# Patient Record
Sex: Female | Born: 1965 | Race: Black or African American | Hispanic: No | Smoking: Never smoker
Health system: Southern US, Community
[De-identification: ages and names within clinical notes are randomized; demographics above are authoritative.]

## PROBLEM LIST (undated history)

## (undated) DIAGNOSIS — Z201 Contact with and (suspected) exposure to tuberculosis: Secondary | ICD-10-CM

## (undated) DIAGNOSIS — M940 Chondrocostal junction syndrome [Tietze]: Secondary | ICD-10-CM

## (undated) DIAGNOSIS — R32 Unspecified urinary incontinence: Secondary | ICD-10-CM

## (undated) DIAGNOSIS — R51 Headache: Secondary | ICD-10-CM

## (undated) DIAGNOSIS — O24419 Gestational diabetes mellitus in pregnancy, unspecified control: Secondary | ICD-10-CM

## (undated) DIAGNOSIS — R010 Benign and innocent cardiac murmurs: Secondary | ICD-10-CM

## (undated) HISTORY — DX: Gestational diabetes mellitus in pregnancy, unspecified control: O24.419

## (undated) HISTORY — DX: Headache: R51

## (undated) HISTORY — DX: Unspecified urinary incontinence: R32

## (undated) HISTORY — PX: TONSILLECTOMY: SUR1361

## (undated) HISTORY — DX: Chondrocostal junction syndrome (tietze): M94.0

## (undated) HISTORY — DX: Benign and innocent cardiac murmurs: R01.0

## (undated) HISTORY — DX: Contact with and (suspected) exposure to tuberculosis: Z20.1

## (undated) HISTORY — PX: SHOULDER SURGERY: SHX246

---

## 1990-09-14 DIAGNOSIS — Z201 Contact with and (suspected) exposure to tuberculosis: Secondary | ICD-10-CM

## 1990-09-14 HISTORY — DX: Contact with and (suspected) exposure to tuberculosis: Z20.1

## 1996-09-14 HISTORY — PX: NASAL SINUS SURGERY: SHX719

## 2002-09-14 HISTORY — PX: ENDOMETRIAL ABLATION: SHX621

## 2002-09-14 HISTORY — PX: TUBAL LIGATION: SHX77

## 2002-10-30 ENCOUNTER — Emergency Department (HOSPITAL_COMMUNITY): Admission: EM | Admit: 2002-10-30 | Discharge: 2002-10-30 | Payer: Self-pay | Admitting: Emergency Medicine

## 2002-10-30 ENCOUNTER — Other Ambulatory Visit: Admission: RE | Admit: 2002-10-30 | Discharge: 2002-10-30 | Payer: Self-pay | Admitting: Obstetrics and Gynecology

## 2002-10-31 ENCOUNTER — Other Ambulatory Visit: Admission: RE | Admit: 2002-10-31 | Discharge: 2002-10-31 | Payer: Self-pay | Admitting: Obstetrics and Gynecology

## 2003-01-18 ENCOUNTER — Encounter: Payer: Self-pay | Admitting: Obstetrics and Gynecology

## 2003-01-18 ENCOUNTER — Ambulatory Visit (HOSPITAL_COMMUNITY): Admission: RE | Admit: 2003-01-18 | Discharge: 2003-01-18 | Payer: Self-pay | Admitting: Obstetrics and Gynecology

## 2003-03-12 ENCOUNTER — Encounter: Admission: RE | Admit: 2003-03-12 | Discharge: 2003-03-26 | Payer: Self-pay | Admitting: Obstetrics and Gynecology

## 2003-04-12 ENCOUNTER — Inpatient Hospital Stay (HOSPITAL_COMMUNITY): Admission: AD | Admit: 2003-04-12 | Discharge: 2003-04-15 | Payer: Self-pay | Admitting: Obstetrics and Gynecology

## 2003-04-13 ENCOUNTER — Encounter: Payer: Self-pay | Admitting: Obstetrics and Gynecology

## 2003-05-25 ENCOUNTER — Inpatient Hospital Stay (HOSPITAL_COMMUNITY): Admission: AD | Admit: 2003-05-25 | Discharge: 2003-05-25 | Payer: Self-pay | Admitting: Obstetrics and Gynecology

## 2003-06-02 ENCOUNTER — Inpatient Hospital Stay (HOSPITAL_COMMUNITY): Admission: AD | Admit: 2003-06-02 | Discharge: 2003-06-04 | Payer: Self-pay | Admitting: Obstetrics and Gynecology

## 2003-06-03 ENCOUNTER — Encounter (INDEPENDENT_AMBULATORY_CARE_PROVIDER_SITE_OTHER): Payer: Self-pay

## 2004-02-19 ENCOUNTER — Other Ambulatory Visit: Admission: RE | Admit: 2004-02-19 | Discharge: 2004-02-19 | Payer: Self-pay | Admitting: Obstetrics and Gynecology

## 2004-02-25 ENCOUNTER — Encounter: Admission: RE | Admit: 2004-02-25 | Discharge: 2004-02-25 | Payer: Self-pay | Admitting: Obstetrics and Gynecology

## 2004-06-27 ENCOUNTER — Encounter: Admission: RE | Admit: 2004-06-27 | Discharge: 2004-06-27 | Payer: Self-pay | Admitting: Family Medicine

## 2004-08-19 ENCOUNTER — Ambulatory Visit: Payer: Self-pay | Admitting: Family Medicine

## 2004-08-28 ENCOUNTER — Ambulatory Visit: Payer: Self-pay | Admitting: Family Medicine

## 2004-09-11 ENCOUNTER — Ambulatory Visit (HOSPITAL_COMMUNITY): Admission: RE | Admit: 2004-09-11 | Discharge: 2004-09-11 | Payer: Self-pay | Admitting: Obstetrics and Gynecology

## 2004-09-16 ENCOUNTER — Ambulatory Visit: Payer: Self-pay | Admitting: Family Medicine

## 2005-07-17 ENCOUNTER — Emergency Department (HOSPITAL_COMMUNITY): Admission: EM | Admit: 2005-07-17 | Discharge: 2005-07-17 | Payer: Self-pay | Admitting: Family Medicine

## 2005-07-19 ENCOUNTER — Emergency Department (HOSPITAL_COMMUNITY): Admission: EM | Admit: 2005-07-19 | Discharge: 2005-07-19 | Payer: Self-pay | Admitting: Emergency Medicine

## 2005-07-29 ENCOUNTER — Emergency Department (HOSPITAL_COMMUNITY): Admission: EM | Admit: 2005-07-29 | Discharge: 2005-07-29 | Payer: Self-pay | Admitting: Emergency Medicine

## 2005-08-02 ENCOUNTER — Emergency Department (HOSPITAL_COMMUNITY): Admission: EM | Admit: 2005-08-02 | Discharge: 2005-08-02 | Payer: Self-pay | Admitting: Family Medicine

## 2005-09-08 ENCOUNTER — Emergency Department (HOSPITAL_COMMUNITY): Admission: EM | Admit: 2005-09-08 | Discharge: 2005-09-08 | Payer: Self-pay | Admitting: Family Medicine

## 2005-09-14 HISTORY — PX: CHOLECYSTECTOMY: SHX55

## 2005-09-28 ENCOUNTER — Emergency Department (HOSPITAL_COMMUNITY): Admission: EM | Admit: 2005-09-28 | Discharge: 2005-09-28 | Payer: Self-pay | Admitting: Family Medicine

## 2005-10-20 ENCOUNTER — Emergency Department (HOSPITAL_COMMUNITY): Admission: EM | Admit: 2005-10-20 | Discharge: 2005-10-20 | Payer: Self-pay | Admitting: Emergency Medicine

## 2005-11-06 ENCOUNTER — Emergency Department (HOSPITAL_COMMUNITY): Admission: EM | Admit: 2005-11-06 | Discharge: 2005-11-06 | Payer: Self-pay | Admitting: Family Medicine

## 2006-11-18 ENCOUNTER — Ambulatory Visit (HOSPITAL_COMMUNITY): Admission: RE | Admit: 2006-11-18 | Discharge: 2006-11-18 | Payer: Self-pay | Admitting: Family Medicine

## 2010-01-23 ENCOUNTER — Emergency Department (HOSPITAL_COMMUNITY): Admission: EM | Admit: 2010-01-23 | Discharge: 2010-01-23 | Payer: Self-pay | Admitting: Plastic Surgery

## 2010-03-21 ENCOUNTER — Ambulatory Visit: Payer: Self-pay | Admitting: Interventional Radiology

## 2010-03-21 ENCOUNTER — Emergency Department (HOSPITAL_BASED_OUTPATIENT_CLINIC_OR_DEPARTMENT_OTHER): Admission: EM | Admit: 2010-03-21 | Discharge: 2010-03-21 | Payer: Self-pay | Admitting: Emergency Medicine

## 2010-11-30 LAB — DIFFERENTIAL
Basophils Absolute: 0.4 10*3/uL — ABNORMAL HIGH (ref 0.0–0.1)
Basophils Relative: 6 % — ABNORMAL HIGH (ref 0–1)
Eosinophils Absolute: 0.2 10*3/uL (ref 0.0–0.7)
Eosinophils Relative: 3 % (ref 0–5)
Lymphocytes Relative: 35 % (ref 12–46)
Lymphs Abs: 2.1 10*3/uL (ref 0.7–4.0)
Monocytes Absolute: 0.4 10*3/uL (ref 0.1–1.0)
Monocytes Relative: 6 % (ref 3–12)
Neutro Abs: 3 10*3/uL (ref 1.7–7.7)
Neutrophils Relative %: 50 % (ref 43–77)

## 2010-11-30 LAB — CBC
HCT: 36.4 % (ref 36.0–46.0)
Hemoglobin: 12.3 g/dL (ref 12.0–15.0)
MCH: 29.3 pg (ref 26.0–34.0)
MCHC: 33.9 g/dL (ref 30.0–36.0)
MCV: 86.3 fL (ref 78.0–100.0)
Platelets: ADEQUATE 10*3/uL (ref 150–400)
RBC: 4.22 MIL/uL (ref 3.87–5.11)
RDW: 14.1 % (ref 11.5–15.5)
WBC: 6.1 10*3/uL (ref 4.0–10.5)

## 2010-11-30 LAB — BASIC METABOLIC PANEL
BUN: 10 mg/dL (ref 6–23)
CO2: 24 mEq/L (ref 19–32)
Calcium: 8.9 mg/dL (ref 8.4–10.5)
Chloride: 108 mEq/L (ref 96–112)
Creatinine, Ser: 0.4 mg/dL (ref 0.4–1.2)
GFR calc Af Amer: >60 mL/min (ref >60)
GFR calc non Af Amer: >60 mL/min (ref >60)
Glucose, Bld: 89 mg/dL (ref 70–99)
Potassium: 3.8 mEq/L (ref 3.5–5.1)
Sodium: 142 mEq/L (ref 135–145)

## 2010-11-30 LAB — POCT CARDIAC MARKERS
CKMB, poc: <1 ng/mL — ABNORMAL LOW (ref 1.0–8.0)
Myoglobin, poc: 23.2 ng/mL (ref 12–200)
Troponin i, poc: <0.05 ng/mL (ref 0.00–0.09)

## 2011-01-30 NOTE — Op Note (Signed)
   Andrea Chan, Andrea Chan                      ACCOUNT NO.:  [id number]   MEDICAL RECORD NO.:  [id number]                   PATIENT TYPE:  INP   LOCATION:  9134                                 [facility]ILITY[facility]   PHYSICIAN:  Crist Fat. Rivard, M.D.              DATE OF BIRTH:  1966-06-24   DATE OF PROCEDURE:  06/03/2003  DATE OF DISCHARGE:                                 OPERATIVE REPORT   PREOPERATIVE DIAGNOSIS:  Desire for sterilization.   POSTOPERATIVE DIAGNOSIS:  Desire for sterilization.   PROCEDURE:  Postpartum bilateral tubal ligation.   SURGEON:  Crist Fat. Rivard, M.D.   ANESTHESIA:  Epidural.   ESTIMATED BLOOD LOSS:  Minimal.   DESCRIPTION OF PROCEDURE:  After being informed of the planned procedure  with possible complications including bleeding, infection, irreversibility  and failure rate of 1 in 500, informed consent was obtained.  The patient  was taken to operating room #4, given epidural anesthesia with the already  in place epidural catheter.  This was done without complication.  The  patient was then prepped and draped in the sterile fashion.  After assessing  adequate level of anesthesia, the umbilical area is infiltrated with 10 mL  of Marcaine 0.25 mg and a previous umbilical incision is used to perform  this semi-elliptical incision.  This incision was brought down to the fascia  which was identified, grasped with Allis forceps and opened with a knife and  Mayo scissors.  The peritoneum was entered bluntly.  We are able to identify  both tubes in their full length until fimbria is visualized using Babcock  forceps.  The mesosalpinx is entered with cautery.  A section of 1.5 cm of  isthmal ampullary tube is removed after doubly ligating each stump and  cauterizing each stump.  Note that on the right side there is tubo-ovarian  adhesions, but we are still able to proceed with no complications.  Hemostasis is adequate.  We then closed the incision with closure  of the  fascia using a running suture of 0 Vicryl.  Hemostasis of the wound is  completed with cautery and skin is closed with a subcuticular suture of 3-0  Monocryl and Steri-Strips.   Instrument and sponge count is complete x2.  Estimated blood loss is  minimal.  The procedure is well tolerated by the patient who is taken to the  recovery room in a well and stable condition.                                               Crist Fat Rivard, M.D.    SAR/MEDQ  D:  06/03/2003  T:  06/04/2003  Job:  [id number]

## 2011-01-30 NOTE — Discharge Summary (Signed)
   NAMETASHUNDA, Andrea Chan                      ACCOUNT NO.:  [id number]   MEDICAL RECORD NO.:  [id number]                   PATIENT TYPE:  INP   LOCATION:  9153                                 [facility]   PHYSICIAN:  Naima A. Dillard, M.D.              DATE OF BIRTH:  05-20-1966   DATE OF ADMISSION:  04/12/2003  DATE OF DISCHARGE:  04/15/2003                                 DISCHARGE SUMMARY   ADMISSION DIAGNOSES:  1. Intrauterine pregnancy at 30-2/7 weeks.  2. Preterm cervical change.  3. Preterm uterine contractions.  4. Gestational diabetes.   DISCHARGE DIAGNOSES:  1. Intrauterine pregnancy at 30-2/7 weeks.  2. Gestational diabetes.  3. Stable cervical change.   HISTORY OF PRESENT ILLNESS:  Andrea Chan is a 45 year old gravida 5, para 2-0-  2-2, who presented with uterine contractions and cervical change from the  office of [facility].  She was found to be a fingertip dilated and 25% in the  office and following NST, again, at the office she was found to be 1 cm  dilated and 50% effaced. She was begun on magnesium sulfate for tocolysis  and received betamethasone for fetal lung maturity. Her fetal fibronectin  done on that day was negative. She has been stable with no further  contractions in the hospital. Her cervix remains unchanged and on today,  April 15, 2003, her magnesium was weaned. Contractions did not continue, and  she was discharged home with preterm labor precautions and terbutaline 1  tablet every 6 hours if greater than 5 contractions in an hour. She will  continue her diabetic diet and pelvic rest, and she will followup at [facility]  for an appointment this week.     Rica Koyanagi, C.N.M.               Naima A. Normand Sloop, M.D.    SDM/MEDQ  D:  04/15/2003  T:  04/16/2003  Job:  [id number]

## 2011-01-30 NOTE — H&P (Signed)
NAMEMARDIE, KELLEN                      ACCOUNT NO.:  [id number]   MEDICAL RECORD NO.:  [id number]                   PATIENT TYPE:  INP   LOCATION:  9171                                 [facility]   PHYSICIAN:  Crist Fat. Rivard, M.D.              DATE OF BIRTH:  [birth year]   DATE OF ADMISSION:  06/02/2003  DATE OF DISCHARGE:                                HISTORY & PHYSICAL   [name] is a 45 year old married black female, gravida 5, para 2-0-2-2,  at 37-6/7 weeks, who presents complaining of increasing uterine contractions  throughout the night.  She reports that since 5 a.m. they have been every  three to four minutes and have been much more uncomfortable.  She denies any  leaking or vaginal bleeding.  She denies any headache, nausea, vomiting, or  visual disturbances.  Her pregnancy has been followed at [facility] by the M.D. service and has been complicated by:   1. Diet-controlled gestational diabetes.  2. Previous cesarean section, desiring VBAC.  3. History of fibroids.  4. Advanced maternal age with no amniocentesis with this pregnancy.  5. History of breast reduction.  6. History of preterm labor with this pregnancy, though now at term.   Her group B strep is negative.  She desires an epidural for labor.   PAST OBSTETRICAL/GYNECOLOGIC HISTORY:  She is a gravida 5, para 2-0-2-2.  She had a miscarriage in 1986 at approximately 54 weeks' gestation with no  complications.  In March 1996 she delivered a viable female infant who  weighed 5 pounds 6 ounces at 29 weeks' gestation following a 13-hour labor.  She delivered vaginally with no complications.  In 1999 she had an elective  AB with no complications, and in February 2002 she delivered a viable female  infant who weighed 7 pounds 3 ounces at 40 weeks' gestation following a 20-  hour labor.  She, however, was required to have a cesarean section for fetal  heart rate issues, though she reports that  she did get to complete and  pushing stage prior to needing a cesarean section.  She has had the risks  and benefits discussed at length regarding having a vaginal birth after  cesarean section, and she desires to attempt a trial of labor.  She did have  gestational diabetes with her son in 2002.  Other GYN issues:  She has a  history of fibroids, history of preterm labor with both of her pregnancies,  one starting at 36 weeks and one at 32 weeks.   GENERAL MEDICAL HISTORY:  She has no known drug allergies.  She reports  having had the usual childhood diseases.  She reports a history of irritable  bowel syndrome, occasional urinary tract infections.  Surgeries:  She had a  diagnostic laparoscopy at age 33 and was diagnosed with endometriosis.  She  had another diagnostic laparoscopy at age 58, tonsillectomy at  age 40,  breast reduction at age 58, sinus surgery at age 29, C-section at age 75.   FAMILY HISTORY:  Significant for mother's side with heart disease, father  with hypertension with anemia, father of the baby with diabetes and father  with diabetes.  Patient's aunt with ovarian cancer.  Sister with a history  of drug use.   GENETIC HISTORY:  Significant for the fact that she is over age 5 and that  she has sickle cell trait.   SOCIAL HISTORY:  She is married to [employer], who is involved and  supportive.  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  They are of the [place] faith.  They deny any illicit drug  use, alcohol, or smoking with this pregnancy.   PRENATAL LABORATORY DATA:  Her blood type is B positive, antibody screen is  negative.  Sickle cell trait is positive.  Syphilis is negative.  Rubella is  immune. Hepatitis B surface antigen is negative.  HIV is nonreactive.  GC  and Chlamydia are both negative.  Pap was within normal limits.  Her three-  hour GTT was elevated and her maternal serum alpha-fetoprotein was within  normal range.   Thirty-six week beta strep was negative.   PHYSICAL EXAMINATION:  VITAL SIGNS:  Stable.  She is afebrile.  HEENT:  Grossly within normal limits.  CARDIAC:  Her heart is rhythm and rate.  CHEST:  Clear.  BREASTS:  Soft and nontender.  ABDOMEN:  Gravid with uterine contractions every three to four minutes.  Her  fetal heart rate is reactive and reassuring.  PELVIC:  Her cervix is 4 cm, 100%, vertex, -1, with intact membranes and  anterior.  EXTREMITIES:  Within normal limits.   ASSESSMENT:  1. Intrauterine pregnancy at term.  2. Early active labor.  3. Previous cesarean section, desiring vaginal birth after cesarean section.  4. Diet-controlled gestational diabetes.   PLAN:  Her plan per consult with Dois Davenport A. Rivard, M.D., is to admit to  labor and delivery, to follow routine M.D. orders, and to give her an  epidural for labor as desired.     Concha Pyo. Duplantis, C.N.M.              Crist Fat Rivard, M.D.    SJD/MEDQ  D:  06/02/2003  T:  06/02/2003  Job:  [id number]

## 2011-01-30 NOTE — Discharge Summary (Signed)
Andrea Chan, Andrea Chan                      ACCOUNT NO.:  [id number]   MEDICAL RECORD NO.:  [id number]                   PATIENT TYPE:  INP   LOCATION:  9134                                 [facility]   PHYSICIAN:  Naima A. Dillard, M.D.              DATE OF BIRTH:  1965/11/03   DATE OF ADMISSION:  06/02/2003  DATE OF DISCHARGE:  06/04/2003                                 DISCHARGE SUMMARY   ADMISSION DIAGNOSES:  1. Intrauterine pregnancy at term.  2. Early active labor.  3. Previous cesarean section desiring vaginal birth attempt.  4. Diet controlled gestational diabetes.  5. Multiparity and desires bilateral tubal ligation for sterilization.   DISCHARGE DIAGNOSES:  1. Intrauterine pregnancy at term status post normal spontaneous vaginal     delivery.  2. Early active labor.  3. Previous cesarean section desiring vaginal birth attempt.  4. Diet controlled gestational diabetes.  5. Multiparity and desires bilateral tubal ligation for sterilization status     post bilateral tubal ligation for sterilization.  6. Bottle feeding.   PROCEDURES THIS ADMISSION:  1. A normal spontaneous vaginal delivery with viable female infant named     Andrea Chan who had Apgars of 9 and 9 and who weighed 6 pounds 9 ounces on     June 02, 2003 attended in delivery by Dois Davenport A. Rivard, M.D.  2. A bilateral tubal ligation for sterilization on June 03, 2003 also     by Dois Davenport A. Rivard, M.D.   HOSPITAL COURSE:  [name] is a 45 year old married black female gravida  5, para 2-0-2-2 at 37-6/7 weeks who presented complaining of uterine  contractions and was found to be in early labor.  She was a diet controlled  gestational diabetes and also a previous cesarean section desiring a vaginal  birth attempt.  She progressed nicely in labor after receiving an epidural  for pain and pushed one time to delivery of a viable female infant named  Andrea Chan who weighed 6 pounds 9 ounces and had Apgars  of 9 and 9 on June 02, 2003 attended in delivery by Dois Davenport A. Rivard, M.D.  Postpartally she  has done well.  She underwent a bilateral tubal ligation for sterilization  in the morning of June 03, 2003 with no complications attended by  Dois Davenport A. Rivard, M.D.  Please see operative note for further details.  She  has continued to do well postoperatively and she is ambulating, voiding, and  eating without difficulty.  Her vital signs are stable and she has remained  afebrile throughout her postoperative course.  She is bottle feeding.  She  is deemed to be ready for discharge today.   DISCHARGE INSTRUCTIONS:  Her discharge instructions are as per the [facility] handout.   DISCHARGE MEDICATIONS:  1. Motrin 600 mg p.o. q.6 h. p.r.n. for pain.  2. Tylox one to two p.o. q.4-6 h. p.r.n. for pain.  3. Prenatal vitamins daily.    DISCHARGE LABORATORY DATA:  Her hemoglobin is 10.6, her WBC count is 9, her  platelets are 219, and her fasting blood sugar on September 19th was 74.   FOLLOW UP:  Her discharge follow up will be in six weeks at [facility] or p.r.n.     Concha Pyo. Duplantis, C.N.M.              Naima A. Normand Sloop, M.D.    SJD/MEDQ  D:  06/04/2003  T:  06/04/2003  Job:  [id number]

## 2011-01-30 NOTE — Op Note (Signed)
NAMELAKIYA, COTTAM            ACCOUNT NO.:  [id number]   MEDICAL RECORD NO.:  [id number]          PATIENT TYPE:  AMB   LOCATION:  SDC                           [facility]   PHYSICIAN:  Osborn Coho, M.D.   DATE OF BIRTH:  1965-10-10   DATE OF PROCEDURE:  09/11/2004  DATE OF DISCHARGE:                                 OPERATIVE REPORT   PREOPERATIVE DIAGNOSES:  1.  Menorrhagia.  2.  Dysmenorrhea.  3.  Endometriosis.   POSTOPERATIVE DIAGNOSES:  1.  Menorrhagia.  2.  Dysmenorrhea.  3.  Endometriosis.   PROCEDURE:  Hysteroscopy and endometrial ablation via NovaSure.   ANESTHESIA:  MAC.   ATTENDING PHYSICIAN:  Osborn Coho, M.D.   FLUIDS:  1000 mL.   HYSTEROSCOPIC FLUID DEFICIT:  50 mL (sorbitol).   ESTIMATED BLOOD LOSS:  Minimal (less than 10 mL).   URINE OUTPUT:  Straight cath prior to procedure.   COMPLICATIONS:  None.   FINDINGS:  The uterus sounded to 8 cm.  Cervix 4 cm and width 4 cm, 36  seconds for ablation.  No obvious lesions in the endometrial cavity.   DESCRIPTION OF PROCEDURE:  The patient was taken to the operating room after  the risks, benefits, and after the alternatives were discussed with the  patient, the patient verbalized an understanding and consent signed and  witnessed. The patient was given a MAC per anesthesia and prepped and draped  in the normal sterile fashion in the dorsal lithotomy position. The patient  was straight cathed with urine returning.  A bivalve speculum was placed in  the patient's vagina and a total of 10 mL of 1% lidocaine was administered  for a paracervical block. The anterior lip of the cervix was grasped with a  single tooth tenaculum.  The cervix was dilated for passage of the  diagnostic hysteroscope.  The diagnostic hysteroscope was introduced and no  obvious lesions noted in the endometrial cavity.  (The patient is status post Depot Lupron).  The hysteroscope was removed and  endometrial ablation performed  using NovaSure with the findings as noted  above.  Instruments were removed. There was hemostasis at the tenaculum  site. The count was correct.  The patient tolerated the procedure well and  is awaiting transfer to the recovery room.     Ange   AR/MEDQ  D:  09/11/2004  T:  09/11/2004  Job:  [id number]

## 2011-01-30 NOTE — H&P (Signed)
Andrea, Chan                      ACCOUNT NO.:  [id number]   MEDICAL RECORD NO.:  [id number]                   PATIENT TYPE:  INP   LOCATION:  9151                                 [facility]   PHYSICIAN:  Janine Limbo, M.D.            DATE OF BIRTH:  Mar 25, 1966   DATE OF ADMISSION:  04/12/2003  DATE OF DISCHARGE:                                HISTORY & PHYSICAL   HISTORY:  [name] is a 45 year old, married black female, gravida 5, para  2-0-2-2, at 17 and 2/7ths weeks, who presents from the office for monitoring  secondary to cervical change noted at the office.  The patient reported at  her routine visit feeling some crampiness over the last 24 hours or so, and  was initially checked and found to be fingertip and 25%, and was then  monitored at the office.  They were unable to detect uterine contractions at  the office, but after her monitoring session, she was reexamined and was  found to be 1 cm and 50% effaced, and she was sent over to Maternity  Admissions for further observation.  She denies any leaking or vaginal  bleeding.  She reports positive fetal movement.  Her pregnancy has been  followed at [facility] by the MD service and has been  complicated by:  (1) advanced maternal age and declining amnio; (2) previous  cesarean section, unsure regarding the plan for subsequent delivery; (3)  history of gestational diabetes in the past and gestational diabetes with  this pregnancy; (4) history of fibroid; and (5) sickle cell trait.   OB-GYN HISTORY:  She is a gravida 5, para 2-0-2-2, who had a miscarriage in  67 with no complications.  In March of 1996, she delivered a viable female  infant that weighed 5 pounds 6 ounces at 38 weeks' gestation following a 13-  hour labor without complications.  In 1999, she had an elective AB with no  complications.  In February of 2002, she delivered a viable female infant who  weighed 7 pounds 3 ounces at  40 weeks' gestation following a 20-hour labor,  but required an emergency C-section secondary to fetal heart rate issues,  even though she got to complete in the pushing stage.  His name is  __________.  She did have gestational diabetes with her pregnancy in 2002.  She also had oligohydramnios, hyperemesis, postpartum depression, and some  preterm uterine contractions.  She required terbutaline with her first  pregnancy.  She also has a history of fibroids.  She reports a history of  breast reduction in 1987, and a diagnostic laparoscopy for diagnosis of  endometriosis.   GENERAL MEDICAL HISTORY:  She has no known drug allergies.  She reports  having had the usual childhood diseases.  She reports a history of irritable  bowel syndrome and occasional urinary tract infections.  Her surgeries  include:  diagnostic laparoscopy at age 78,  diagnosing endometriosis;  another laparoscopic surgery at age 35.  She had a tonsillectomy at age 23,  breast reduction at age 56, sinus surgery at age 43, and cesarean section at  age 48.  The patient also has sickle cell trait.   FAMILY HISTORY:  Mother's side of the family has heart disease.  Father has  hypertension.  Aunt has anemia.  Her father has diabetes.  The patient's  aunt has a history of ovarian cancer.  Genetic history is noncontributory  with the exception that the patient has sickle cell trait and advanced  maternal age and declining amnio.   SOCIAL HISTORY:  She is married to [employer], who is involved and  supportive.  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  They are of the [place] faith.  They deny any illicit drug use, alcohol,  or smoking with this pregnancy.   PRENATAL LABS:  Her blood type is B-positive.  Her antibody screen is  negative.  Sickle cell trait is positive.  Syphilis is nonreactive.  Rubella  is immune.  Hepatitis B surface antigen is negative.  HIV is nonreactive.  GC and Chlamydia are  both negative.  Pap is within normal limits.  Her 3-  hour GTT was elevated.  Her maternal serum alpha fetoprotein was within  normal range.   PHYSICAL EXAMINATION:  VITAL SIGNS:  Stable.  She is afebrile.  HEENT:  Grossly within normal limits.  HEART:  Regular rhythm and rate.  CHEST:  Clear.  BREASTS:  Soft and nontender.  ABDOMEN:  Gravid, with uterine contractions noted every 2 to 3 minutes.  Fetal heart rate is reactive and reassuring.  GENITALIA:  Her cervix, on last exam, was 1 cm, 50%, vertex, posterior, per  Dr. Su Hilt at the office.  EXTREMITIES:  Within normal limits.   Fetal fibronectin, gonorrhea, Chlamydia, and group B strep are all pending  from the office today.   ASSESSMENT:  1. Intrauterine pregnancy at 30 and 2/7ths weeks.  2. Preterm cervical change.  3. Preterm uterine contractions.  4. Gestational diabetes.  5. Fibroids.  6. Sickle cell trait.   PLAN:  Per consult with Dr. Janine Limbo is to admit to Antenatal Unit  to give her magnesium sulfate for tocolysis, betamethasone series for fetal  lung maturity, and to put her on a sliding scale insulin.      Concha Pyo. Duplantis, C.N.M.              Janine Limbo, M.D.    SJD/MEDQ  D:  04/12/2003  T:  04/12/2003  Job:  [id number]

## 2011-07-13 ENCOUNTER — Encounter: Payer: Self-pay | Admitting: Family Medicine

## 2011-07-13 ENCOUNTER — Ambulatory Visit (INDEPENDENT_AMBULATORY_CARE_PROVIDER_SITE_OTHER): Payer: BC Managed Care – PPO | Admitting: Family Medicine

## 2011-07-13 VITALS — BP 124/70 | HR 72 | Temp 98.6°F | Ht 63.0 in | Wt 146.0 lb

## 2011-07-13 DIAGNOSIS — Z8632 Personal history of gestational diabetes: Secondary | ICD-10-CM | POA: Insufficient documentation

## 2011-07-13 DIAGNOSIS — K219 Gastro-esophageal reflux disease without esophagitis: Secondary | ICD-10-CM

## 2011-07-13 DIAGNOSIS — M940 Chondrocostal junction syndrome [Tietze]: Secondary | ICD-10-CM | POA: Insufficient documentation

## 2011-07-13 DIAGNOSIS — J329 Chronic sinusitis, unspecified: Secondary | ICD-10-CM | POA: Insufficient documentation

## 2011-07-13 DIAGNOSIS — Z0000 Encounter for general adult medical examination without abnormal findings: Secondary | ICD-10-CM

## 2011-07-13 LAB — LIPID PANEL
Cholesterol: 142 mg/dL (ref 0–200)
HDL: 46 mg/dL (ref >39.00)
LDL Cholesterol: 82 mg/dL (ref 0–99)
Total CHOL/HDL Ratio: 3
Triglycerides: 71 mg/dL (ref 0.0–149.0)
VLDL: 14.2 mg/dL (ref 0.0–40.0)

## 2011-07-13 LAB — COMPREHENSIVE METABOLIC PANEL
ALT: 21 U/L (ref 0–35)
AST: 15 U/L (ref 0–37)
Albumin: 4.5 g/dL (ref 3.5–5.2)
Alkaline Phosphatase: 53 U/L (ref 39–117)
BUN: 13 mg/dL (ref 6–23)
CO2: 26 mEq/L (ref 19–32)
Calcium: 9.1 mg/dL (ref 8.4–10.5)
Chloride: 108 mEq/L (ref 96–112)
Creatinine, Ser: 0.5 mg/dL (ref 0.4–1.2)
GFR: 180.32 mL/min (ref >60.00)
Glucose, Bld: 91 mg/dL (ref 70–99)
Potassium: 3.6 mEq/L (ref 3.5–5.1)
Sodium: 141 mEq/L (ref 135–145)
Total Bilirubin: 0.4 mg/dL (ref 0.3–1.2)
Total Protein: 8 g/dL (ref 6.0–8.3)

## 2011-07-13 LAB — CBC WITH DIFFERENTIAL/PLATELET
Basophils Absolute: 0 10*3/uL (ref 0.0–0.1)
Basophils Relative: 0.4 % (ref 0.0–3.0)
Eosinophils Absolute: 0.2 10*3/uL (ref 0.0–0.7)
Eosinophils Relative: 3.2 % (ref 0.0–5.0)
HCT: 36.9 % (ref 36.0–46.0)
Hemoglobin: 12.4 g/dL (ref 12.0–15.0)
Lymphocytes Relative: 36.9 % (ref 12.0–46.0)
Lymphs Abs: 1.8 10*3/uL (ref 0.7–4.0)
MCHC: 33.5 g/dL (ref 30.0–36.0)
MCV: 87.9 fl (ref 78.0–100.0)
Monocytes Absolute: 0.3 10*3/uL (ref 0.1–1.0)
Monocytes Relative: 5.5 % (ref 3.0–12.0)
Neutro Abs: 2.7 10*3/uL (ref 1.4–7.7)
Neutrophils Relative %: 54 % (ref 43.0–77.0)
Platelets: 258 10*3/uL (ref 150.0–400.0)
RBC: 4.2 Mil/uL (ref 3.87–5.11)
RDW: 15.3 % — ABNORMAL HIGH (ref 11.5–14.6)
WBC: 5 10*3/uL (ref 4.5–10.5)

## 2011-07-13 LAB — TSH: TSH: 0.51 u[IU]/mL (ref 0.35–5.50)

## 2011-07-13 MED ORDER — ONDANSETRON HCL 4 MG PO TABS: 4.0000 mg | ORAL_TABLET | Freq: Every day | ORAL | Status: DC | PRN
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
## 2011-07-13 NOTE — Assessment & Plan Note (Signed)
Fasting today - check cbg.

## 2011-07-13 NOTE — Patient Instructions (Addendum)
You do likely have costochondritis.  Treat with tylenol up to 1000mg  three times a day for pain.  We will avoid NSAIDs (ibuprofen, motrin, alleve) given stomach issues. For stomach - I've sent a prescription for zofran to take as needed for nausea. If reflux not improving with time, let me know. Return at your convenience for physical to review blood work. Good to meet you today, call us with questions.

## 2011-07-13 NOTE — Assessment & Plan Note (Addendum)
S/p recent course of augmentin then zpack.  Pt declines meds for now, monitor for improvement, will watch diet.  Knows foods to avoid. rec increase flonase to 2 sprays daily per nostril.

## 2011-07-13 NOTE — Assessment & Plan Note (Signed)
Does have this, possible chronic. Will treat for now with high dose tylenol (avoid NSAIDs 2/2 GERD issues recently) and ice/heat. If not improving, refer to PT for eval/treatment

## 2011-08-31 ENCOUNTER — Ambulatory Visit: Payer: BC Managed Care – PPO | Admitting: Family Medicine

## 2011-09-01 ENCOUNTER — Encounter: Payer: Self-pay | Admitting: Family Medicine

## 2011-09-01 ENCOUNTER — Ambulatory Visit (INDEPENDENT_AMBULATORY_CARE_PROVIDER_SITE_OTHER): Payer: BC Managed Care – PPO | Admitting: Family Medicine

## 2011-09-01 DIAGNOSIS — L989 Disorder of the skin and subcutaneous tissue, unspecified: Secondary | ICD-10-CM | POA: Insufficient documentation

## 2011-09-01 DIAGNOSIS — R079 Chest pain, unspecified: Secondary | ICD-10-CM | POA: Insufficient documentation

## 2011-09-01 LAB — CK TOTAL AND CKMB (NOT AT ARMC)
CK, MB: 1.3 ng/mL (ref 0.3–4.0)
Relative Index: 0.9 (ref 0.0–2.5)
Total CK: 148 U/L (ref 7–177)

## 2011-09-01 LAB — TROPONIN I: Troponin I: <0.01 ng/mL (ref <0.06)

## 2011-09-01 MED ORDER — PREDNISONE 20 MG PO TABS: ORAL_TABLET | ORAL | Status: DC

## 2011-09-01 NOTE — Assessment & Plan Note (Signed)
Mild case of ringworm, treat with clotrimazole bid x 2 wks.
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
## 2011-09-01 NOTE — Patient Instructions (Addendum)
For ringworm - lotrimin twice daily for 2 weeks.  Let me know if not better. Sounds like you have reactivation of costochondritis. Treat with high dose tylenol 1000mg  three times daily. Start prednisone taper for next 9 days. EKG today as well as blood work to help rule out heart as the problem. Call us on Thursday with an update.  If not improving, return Thursday or Friday to be seen.

## 2011-09-01 NOTE — Assessment & Plan Note (Addendum)
very MSK pain today. Anticipate acute flare of chronic costochondritis. Treat with tylenol high dose 1000mg  TID as well as prednisone taper as pt states this has helped in past. Consider PT vs stronger pain meds if not improving Check EKG and stat enzymes today to definitively r/o cardiac cause (no EKG in chart, no records of cardiac w/u). EKG today - NSR at rate of 57, no ST/T changes, normal axis, intervals. Update [place] if no improvement in 2 days, return for recheck if not better by Friday.

## 2011-09-11 ENCOUNTER — Telehealth: Payer: Self-pay | Admitting: Internal Medicine

## 2011-09-11 ENCOUNTER — Telehealth: Payer: Self-pay | Admitting: Family Medicine

## 2011-09-11 MED ORDER — TRAMADOL HCL 50 MG PO TABS: 50.0000 mg | ORAL_TABLET | Freq: Four times a day (QID) | ORAL | Status: AC | PRN

## 2011-09-11 NOTE — Telephone Encounter (Signed)
Done

## 2011-09-11 NOTE — Telephone Encounter (Signed)
Patient was seen on 09/01/11 and dx. With Costochondritis.  She called back today and stated she is having pain in shoulders and left arm and she can't lift her arm, she said the Tylenol isn't helping and would like something stronger.  Please advise.

## 2011-09-11 NOTE — Telephone Encounter (Signed)
rx sent for tramadol to take prn.  Please notify pt.  F/u prn. Thanks.

## 2011-09-11 NOTE — Telephone Encounter (Signed)
Medication phoned to pharmacy.  

## 2011-09-11 NOTE — Telephone Encounter (Signed)
See prev phone note. I closed it by accident.

## 2011-09-14 ENCOUNTER — Ambulatory Visit (INDEPENDENT_AMBULATORY_CARE_PROVIDER_SITE_OTHER)
Admission: RE | Admit: 2011-09-14 | Discharge: 2011-09-14 | Disposition: A | Discharge status: Home / Self Care | Payer: BC Managed Care – PPO | Source: Ambulatory Visit | Attending: Family Medicine | Admitting: Family Medicine

## 2011-09-14 ENCOUNTER — Encounter: Payer: Self-pay | Admitting: Family Medicine

## 2011-09-14 ENCOUNTER — Ambulatory Visit (INDEPENDENT_AMBULATORY_CARE_PROVIDER_SITE_OTHER): Payer: BC Managed Care – PPO | Admitting: Family Medicine

## 2011-09-14 VITALS — BP 116/80 | HR 76 | Temp 98.7°F | Wt 150.0 lb

## 2011-09-14 DIAGNOSIS — M940 Chondrocostal junction syndrome [Tietze]: Secondary | ICD-10-CM

## 2011-09-14 DIAGNOSIS — M542 Cervicalgia: Secondary | ICD-10-CM

## 2011-09-14 DIAGNOSIS — R079 Chest pain, unspecified: Secondary | ICD-10-CM

## 2011-09-14 MED ORDER — CYCLOBENZAPRINE HCL 5 MG PO TABS: 5.0000 mg | ORAL_TABLET | Freq: Two times a day (BID) | ORAL | Status: AC | PRN

## 2011-09-14 NOTE — Patient Instructions (Addendum)
This may be coming from the spine/neck. We will set you up for physical therapy for costochondritis as well as for further evaluation. Neck xray today. Try tramadol, I've also sent in flexeril (muscle relaxant for neck muscles)
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
## 2011-09-14 NOTE — Assessment & Plan Note (Signed)
Refer tp PT.

## 2011-10-15 ENCOUNTER — Ambulatory Visit: Payer: BC Managed Care – PPO | Admitting: Family Medicine

## 2011-11-01 ENCOUNTER — Encounter: Payer: Self-pay | Admitting: Family Medicine

## 2011-11-18 ENCOUNTER — Other Ambulatory Visit: Payer: Self-pay | Admitting: Obstetrics and Gynecology

## 2011-11-18 DIAGNOSIS — Z1231 Encounter for screening mammogram for malignant neoplasm of breast: Secondary | ICD-10-CM

## 2011-12-08 ENCOUNTER — Ambulatory Visit (INDEPENDENT_AMBULATORY_CARE_PROVIDER_SITE_OTHER): Payer: BC Managed Care – PPO | Admitting: Obstetrics and Gynecology

## 2011-12-08 DIAGNOSIS — Z01419 Encounter for gynecological examination (general) (routine) without abnormal findings: Secondary | ICD-10-CM

## 2011-12-21 ENCOUNTER — Encounter: Payer: Self-pay | Admitting: Pulmonary Disease

## 2011-12-21 ENCOUNTER — Ambulatory Visit (INDEPENDENT_AMBULATORY_CARE_PROVIDER_SITE_OTHER): Payer: BC Managed Care – PPO | Admitting: Pulmonary Disease

## 2011-12-21 DIAGNOSIS — R059 Cough, unspecified: Secondary | ICD-10-CM

## 2011-12-21 DIAGNOSIS — J329 Chronic sinusitis, unspecified: Secondary | ICD-10-CM

## 2011-12-21 DIAGNOSIS — R05 Cough: Secondary | ICD-10-CM | POA: Insufficient documentation

## 2011-12-21 NOTE — Assessment & Plan Note (Signed)
The patient's symptoms are most concerning for chronic sinusitis.  I have explained to her this can result in recurrent pulmonary infections if not adequately treated.  She has ongoing sinus symptoms, and therefore would like to image her sinuses to rule out chronic sinusitis.

## 2011-12-21 NOTE — Assessment & Plan Note (Signed)
The patient has a history of cough and ongoing pulmonary symptoms and sensations that may simply be due to asthma.  She has been on Advair for 4 weeks, and feels that her symptoms have improved.  However, she does describe symptoms that are consistent with persistent air trapping.  I really do not want to commit her to lifelong medication without knowing for sure that she does have asthma.  Will therefore discontinue her current inhaler for maintenance, and schedule for spirometry in 2-3 weeks.

## 2011-12-21 NOTE — Progress Notes (Signed)
  Subjective:    Patient ID: Andrea Chan, female    DOB: 02-05-1966, 46 y.o.   MRN: [id number]  HPI The patient is a 46 year old female who I've been asked to see or persistent pulmonary issues.  The patient states that she has had lung problems for years, and describes different atypical sensations in her left chest any time that she gets a cold, sinus issues, et Karie Soda.  She has a history of chronic intermittent cough with skin and mucous that is discolored fairly frequently.  However, she has a history of long-standing sinus disease with frequent infections that required surgery approximately 15 years ago.  She has chronic congestion and pressure in her sinuses, as well as postnasal drip.  She denies chronic reflux symptoms, but has noticed increased symptoms the last month or so.  She has been started on Advair for the last 4 weeks, and thinks this has helped her cough and chest symptoms.  She had a chest x-ray last month that was completely within normal limits.  She denies any shortness of breath issues or dyspnea on exertion when she is not sick, but often feels a fullness in her chest that sounds consistent with air trapping.   Review of Systems  Constitutional: Negative for fever and unexpected weight change.  HENT: Positive for congestion and sore throat. Negative for ear pain, nosebleeds, rhinorrhea, sneezing, trouble swallowing, dental problem, postnasal drip and sinus pressure.   Eyes: Negative for redness and itching.  Respiratory: Negative for cough, chest tightness, shortness of breath and wheezing.   Cardiovascular: Negative for palpitations and leg swelling.  Gastrointestinal: Negative for nausea and vomiting.  Genitourinary: Negative for dysuria.  Musculoskeletal: Positive for joint swelling.  Skin: Positive for rash.  Neurological: Negative for headaches.  Hematological: Does not bruise/bleed easily.  Psychiatric/Behavioral: Negative for dysphoric mood. The patient is not  nervous/anxious.        Objective:   Physical Exam Constitutional:  Well developed, no acute distress  HENT:  Nares patent, but purulent material noted in both nares.  +inflammed mucosal changes.  Oropharynx without exudate, palate and uvula are normal  Eyes:  Perrla, eomi, no scleral icterus  Neck:  No JVD, no TMG  Cardiovascular:  Normal rate, regular rhythm, no rubs or gallops.  No murmurs        Intact distal pulses  Pulmonary :  Normal breath sounds, no stridor or respiratory distress   No rales, rhonchi, or wheezing  Abdominal:  Soft, nondistended, bowel sounds present.  No tenderness noted.   Musculoskeletal:  No lower extremity edema noted.  Lymph Nodes:  No cervical lymphadenopathy noted  Skin:  No cyanosis noted  Neurologic:  Alert, appropriate, moves all 4 extremities without obvious deficit.         Assessment & Plan:

## 2011-12-21 NOTE — Patient Instructions (Signed)
Will get you set up for a scan of your sinuses to make sure this isn't due to chronic sinusitis. Stop advair, but can use albuterol every 6 hrs if needed. Will see you back in 2 weeks, and will do breathing studies to evaluate for possible asthma.  If your breathing significantly worsens before then, please call us so we can get you in to do breathing studies earlier.

## 2011-12-24 ENCOUNTER — Ambulatory Visit (INDEPENDENT_AMBULATORY_CARE_PROVIDER_SITE_OTHER)
Admission: RE | Admit: 2011-12-24 | Discharge: 2011-12-24 | Disposition: A | Discharge status: Home / Self Care | Payer: BC Managed Care – PPO | Source: Ambulatory Visit | Attending: Pulmonary Disease | Admitting: Pulmonary Disease

## 2011-12-24 DIAGNOSIS — J329 Chronic sinusitis, unspecified: Secondary | ICD-10-CM

## 2012-01-06 ENCOUNTER — Ambulatory Visit (INDEPENDENT_AMBULATORY_CARE_PROVIDER_SITE_OTHER): Payer: BC Managed Care – PPO | Admitting: Pulmonary Disease

## 2012-01-06 DIAGNOSIS — R05 Cough: Secondary | ICD-10-CM

## 2012-01-06 DIAGNOSIS — R059 Cough, unspecified: Secondary | ICD-10-CM

## 2012-01-06 LAB — PULMONARY FUNCTION TEST

## 2012-01-06 NOTE — Progress Notes (Signed)
PFT done today. 

## 2012-01-08 ENCOUNTER — Telehealth: Payer: Self-pay | Admitting: *Deleted

## 2012-01-08 NOTE — Telephone Encounter (Signed)
Per KC, pt needs ov with him to discuss PFT results.  LMOM for pt TCB to schedule OV with KC.  I held some slots on Monday if she would like to come in then.

## 2012-01-11 NOTE — Telephone Encounter (Signed)
LMOM for pt TCB 

## 2012-01-11 NOTE — Telephone Encounter (Signed)
Made an appt for pt w/ KC on 01/14/2012 for PFT results.  Nothing further needed at this time.   Andrea Chan

## 2012-01-14 ENCOUNTER — Ambulatory Visit (INDEPENDENT_AMBULATORY_CARE_PROVIDER_SITE_OTHER): Payer: BC Managed Care – PPO | Admitting: Pulmonary Disease

## 2012-01-14 ENCOUNTER — Encounter: Payer: Self-pay | Admitting: Pulmonary Disease

## 2012-01-14 VITALS — BP 98/58 | HR 70 | Temp 98.0°F | Ht 63.0 in | Wt 152.2 lb

## 2012-01-14 DIAGNOSIS — R059 Cough, unspecified: Secondary | ICD-10-CM

## 2012-01-14 DIAGNOSIS — R05 Cough: Secondary | ICD-10-CM

## 2012-01-14 MED ORDER — ALBUTEROL SULFATE HFA 108 (90 BASE) MCG/ACT IN AERS: 2.0000 | INHALATION_SPRAY | Freq: Four times a day (QID) | RESPIRATORY_TRACT | Status: AC | PRN

## 2012-01-14 MED ORDER — OMEPRAZOLE 40 MG PO CPDR: 40.0000 mg | DELAYED_RELEASE_CAPSULE | Freq: Two times a day (BID) | ORAL | Status: DC

## 2012-01-14 NOTE — Patient Instructions (Signed)
Stop advair If you develop severe shortness of breath, sit down to see if improves.  If does not, then can use albuterol for rescue if your symptoms are severe Will start on omeprazole 40mg  one in am and pm about before meals. followup with me in 4 weeks to check on progress.

## 2012-01-14 NOTE — Assessment & Plan Note (Signed)
The patient's memory function studies today are totally normal, with nothing to suggest airflow obstruction.  I would find it very unlikely that her current symptoms were being caused by asthma, although I cannot 100% exclude this.  She is continuing to have chest discomfort, and "episodes of shortness of breath".  She also has dysphonia today.  I would like to treat her aggressively for laryngopharyngeal reflux to see if this is the cause of her symptoms.  She really does not have significant sinus disease, and as stated above her PFTs are normal.  If she continues to have issues despite taking proton pump inhibitor b.i.d., would consider a methacholine challenge test to put the issue of asthma totally to rest.

## 2012-01-14 NOTE — Progress Notes (Signed)
  Subjective:    Patient ID: Andrea Chan, female    DOB: December 17, 1965, 46 y.o.   MRN: [id number]  HPI The patient comes in today for followup of her cough and dyspnea.  At the last visit, it was unclear whether the patient truly had asthma or not, or whether this was all upper airway in origin.  I discontinued her Advair, and also checked a scan of her sinuses that did not show chronic sinusitis.  She had full pulmonary function studies last week, and these were totally within normal limits.  She states that she had issues with her breathing and cough last night, and has significant dystonia today   Review of Systems  Constitutional: Negative for fever and unexpected weight change.  HENT: Positive for congestion, sore throat, rhinorrhea, sneezing, postnasal drip and sinus pressure. Negative for ear pain, nosebleeds, trouble swallowing and dental problem.   Eyes: Positive for redness and itching.  Respiratory: Positive for cough, chest tightness, shortness of breath and wheezing.   Cardiovascular: Negative for palpitations and leg swelling.  Gastrointestinal: Positive for nausea. Negative for vomiting.  Genitourinary: Negative for dysuria.  Musculoskeletal: Negative for joint swelling.  Skin: Negative for rash.  Neurological: Positive for headaches.  Hematological: Does not bruise/bleed easily.  Psychiatric/Behavioral: Negative for dysphoric mood. The patient is not nervous/anxious.        Objective:   Physical Exam Well-developed female in no acute distress Nose without purulence or discharge noted Oropharynx clear Chest totally clear to auscultation, no wheezing Cardiac exam regular rate and rhythm Lower extremities without edema, no cyanosis Alert and oriented, moves all 4 extremities.       Assessment & Plan:

## 2012-01-18 ENCOUNTER — Encounter: Payer: Self-pay | Admitting: Pulmonary Disease

## 2012-02-12 ENCOUNTER — Ambulatory Visit: Payer: BC Managed Care – PPO | Admitting: Pulmonary Disease

## 2012-03-05 ENCOUNTER — Other Ambulatory Visit: Payer: Self-pay | Admitting: Pulmonary Disease

## 2012-05-27 ENCOUNTER — Other Ambulatory Visit (HOSPITAL_COMMUNITY): Payer: Self-pay | Admitting: Internal Medicine

## 2012-05-27 DIAGNOSIS — J45909 Unspecified asthma, uncomplicated: Secondary | ICD-10-CM

## 2012-06-02 ENCOUNTER — Encounter (HOSPITAL_COMMUNITY): Payer: BC Managed Care – PPO

## 2012-06-13 ENCOUNTER — Encounter (HOSPITAL_COMMUNITY): Payer: BC Managed Care – PPO

## 2012-09-29 ENCOUNTER — Ambulatory Visit (HOSPITAL_COMMUNITY)
Admission: RE | Admit: 2012-09-29 | Discharge: 2012-09-29 | Disposition: A | Discharge status: Home / Self Care | Payer: BC Managed Care – PPO | Source: Ambulatory Visit | Attending: Internal Medicine | Admitting: Internal Medicine

## 2012-09-29 DIAGNOSIS — J45909 Unspecified asthma, uncomplicated: Secondary | ICD-10-CM | POA: Insufficient documentation

## 2012-09-29 MED ORDER — ALBUTEROL SULFATE (5 MG/ML) 0.5% IN NEBU
2.5000 mg | INHALATION_SOLUTION | Freq: Once | RESPIRATORY_TRACT | Status: AC
  Administered 2012-09-29: 2.5 mg via RESPIRATORY_TRACT

## 2012-10-10 ENCOUNTER — Ambulatory Visit: Payer: BC Managed Care – PPO | Admitting: Obstetrics and Gynecology

## 2013-08-26 IMAGING — CT CT PARANASAL SINUSES LIMITED
1 of 2 series · 16 of 19 positions shown, 20 images · non-contrast
Comparison: None.

CLINICAL DATA: Chronic sinusitis.  Sinus surgery 15 years ago.

CT LIMITED SINUSES WITHOUT CONTRAST
TECHNIQUE: Multidetector CT images of the paranasal sinuses were
obtained in a single plane without contrast.

[Series 4: ltd sinus 3.0 h30s · axial · 0.39mm/px · z∈[-83,+14]mm · 16 of 18 slices shown, 20 images]
[im 2/18  brain]
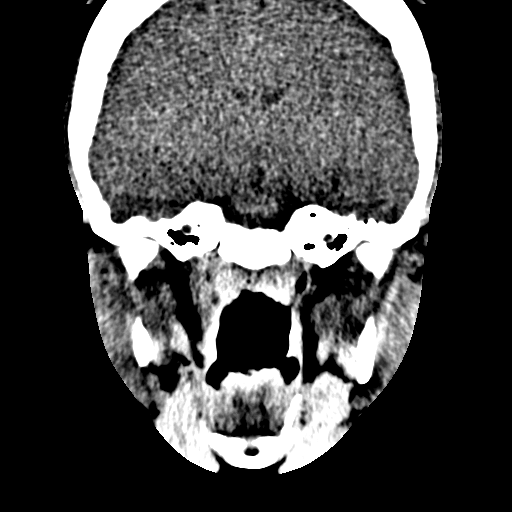
[im 2/18  bone]
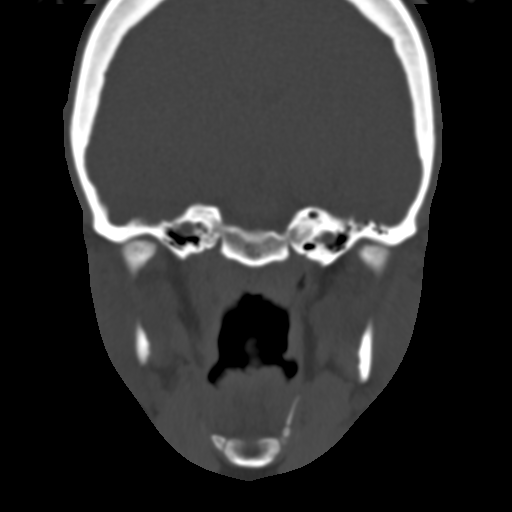
[im 3/18  bone]
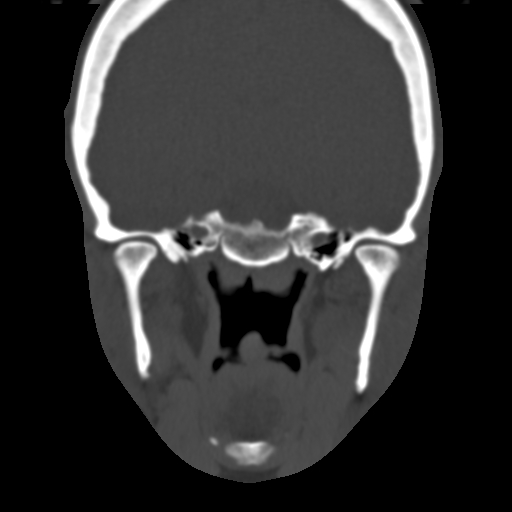
[im 4/18  bone]
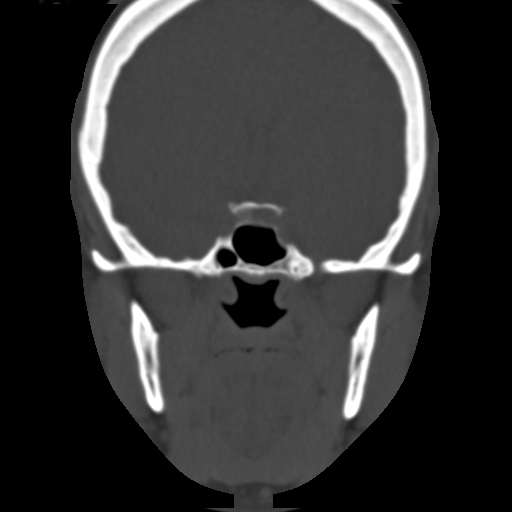
[im 5/18  bone]
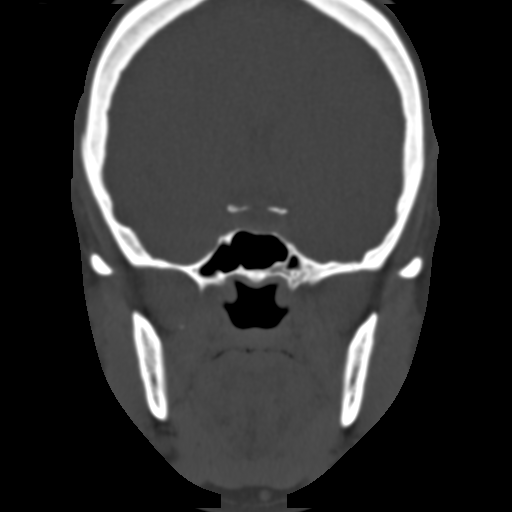
[im 6/18  brain]
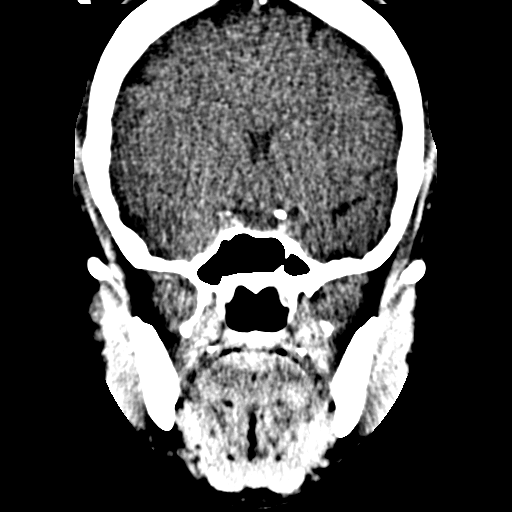
[im 6/18  bone]
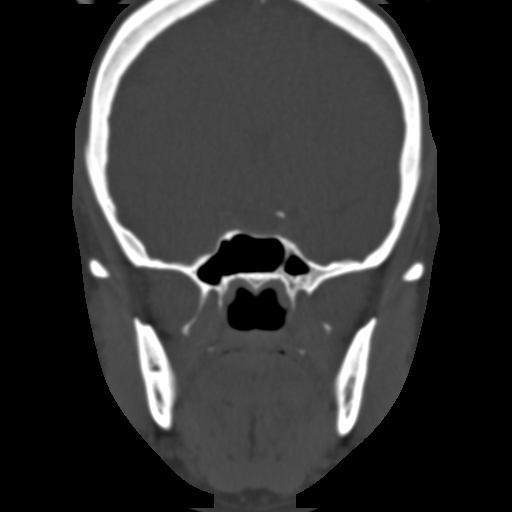
[im 7/18  bone]
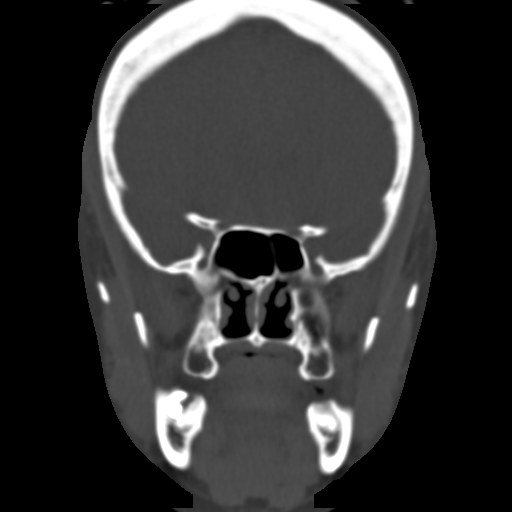
[im 8/18  bone]
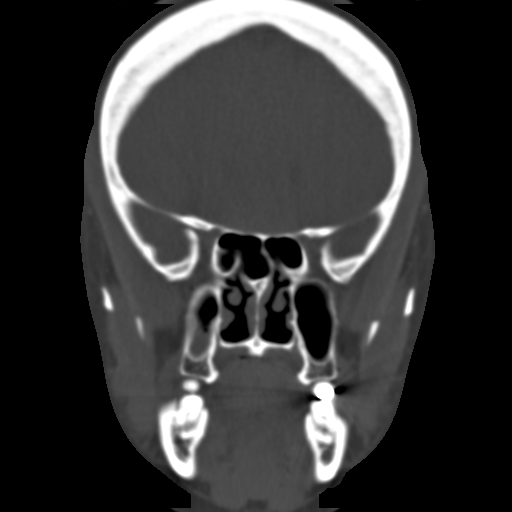
[im 9/18  bone]
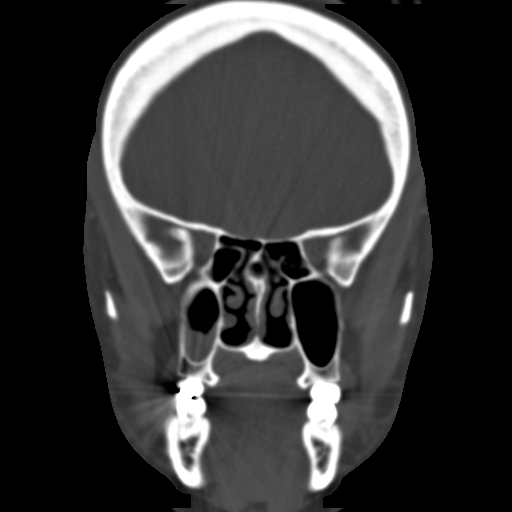
[im 10/18  brain]
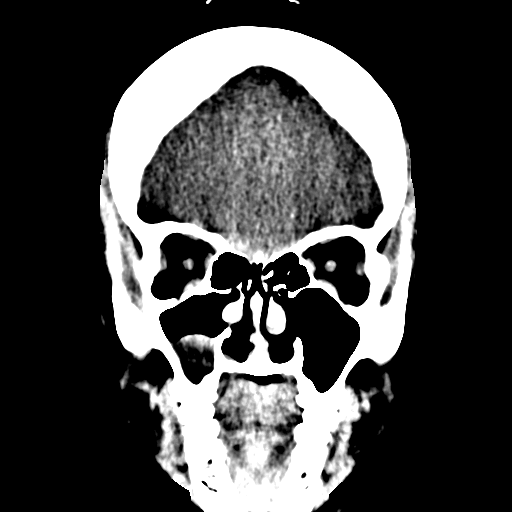
[im 10/18  bone]
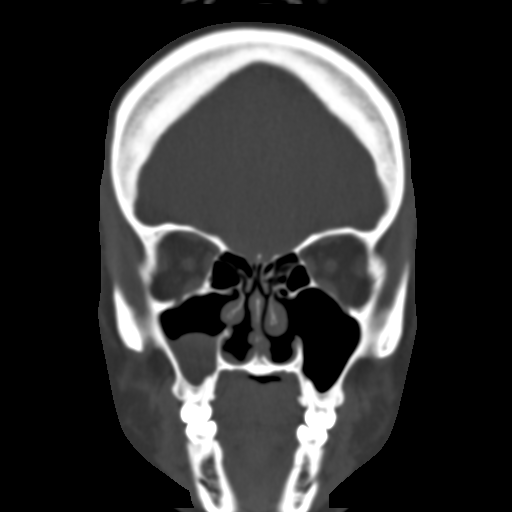
[im 11/18  bone]
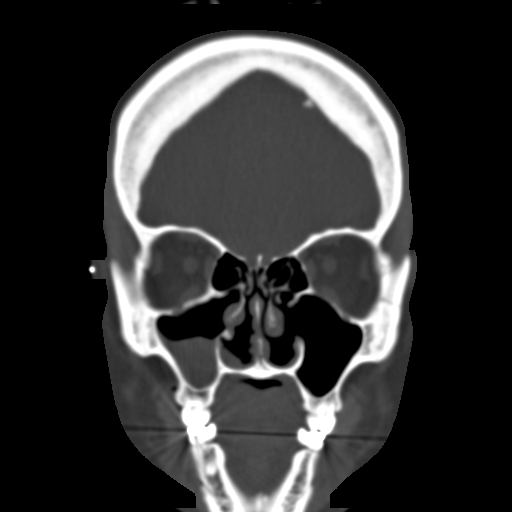
[im 12/18  bone]
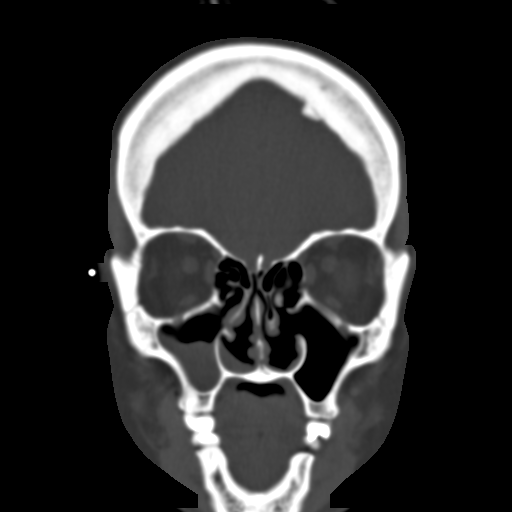
[im 13/18  bone]
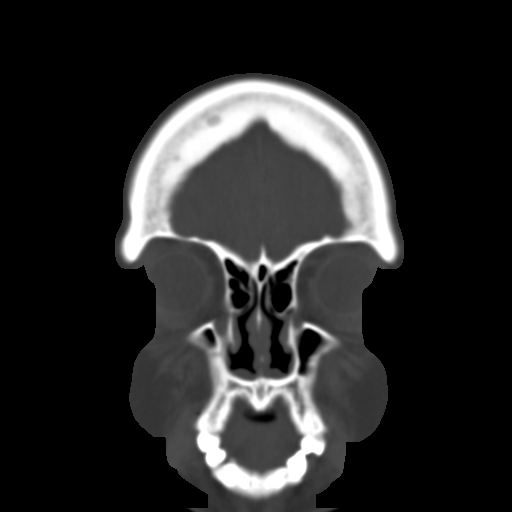
[im 14/18  brain]
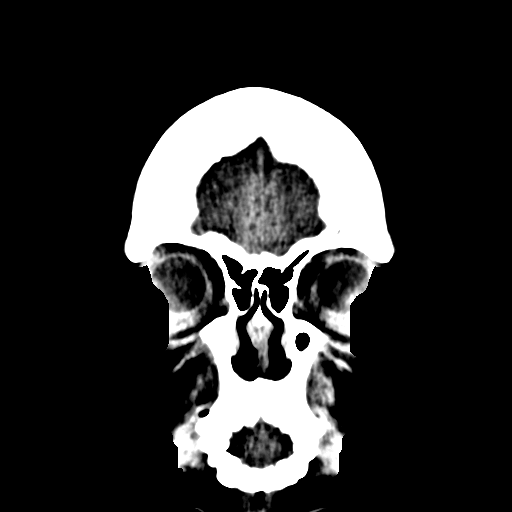
[im 14/18  bone]
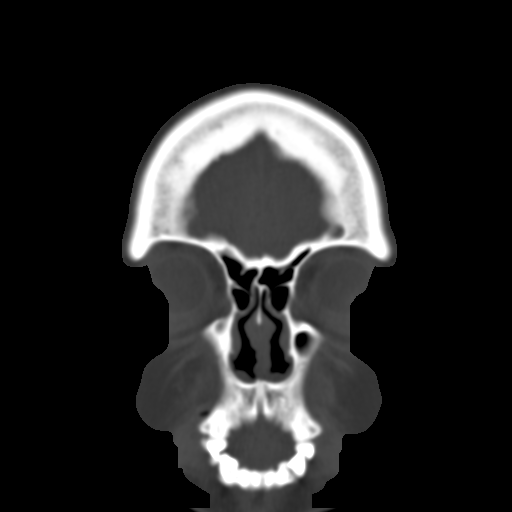
[im 15/18  bone]
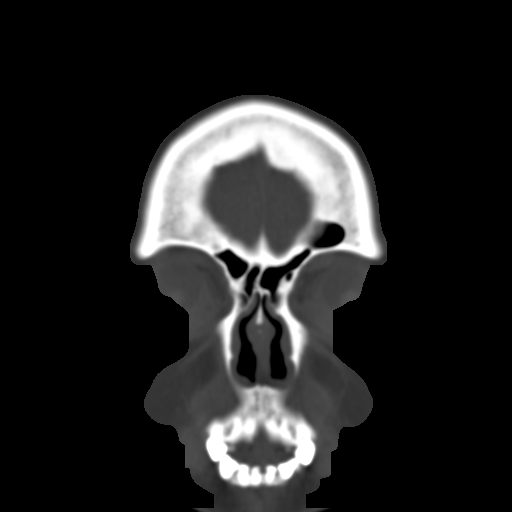
[im 16/18  bone]
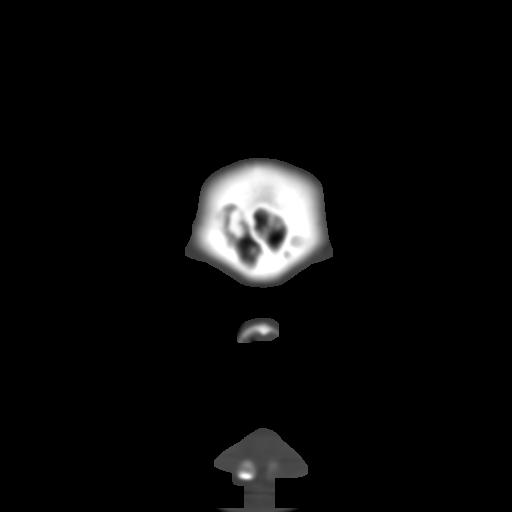
[im 17/18  bone]
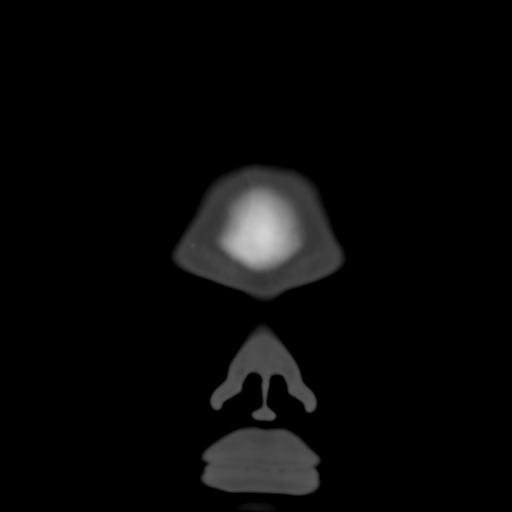

[16 of 19 positions shown; findings below may reference images not displayed]

FINDINGS: Grossly, intracranial contents appear within normal
limits.

Large right maxillary floor mucous retention cyst or polyp.
Frontal sinuses appear clear.  Left maxillary antrectomy has been
performed.  Left maxillary sinus appears normal.  Sphenoid sinuses
appear normal.  No destructive osseous lesions are identified.
IMPRESSION: 1.  Left maxillary antrectomy.
2.  Large mucous retention cyst/polyp in the floor of the right
maxillary sinus.

## 2013-10-10 ENCOUNTER — Ambulatory Visit (HOSPITAL_COMMUNITY): Payer: BC Managed Care – PPO | Admitting: Psychiatry

## 2013-10-18 ENCOUNTER — Other Ambulatory Visit: Payer: Self-pay | Admitting: Internal Medicine

## 2013-10-18 DIAGNOSIS — R1011 Right upper quadrant pain: Secondary | ICD-10-CM

## 2013-10-24 ENCOUNTER — Other Ambulatory Visit: Payer: Self-pay

## 2013-11-01 ENCOUNTER — Ambulatory Visit
Admission: RE | Admit: 2013-11-01 | Discharge: 2013-11-01 | Disposition: A | Discharge status: Home / Self Care | Payer: BC Managed Care – PPO | Source: Ambulatory Visit | Attending: Internal Medicine | Admitting: Internal Medicine

## 2013-11-01 DIAGNOSIS — R1011 Right upper quadrant pain: Secondary | ICD-10-CM

## 2014-01-18 ENCOUNTER — Other Ambulatory Visit: Payer: Self-pay | Admitting: Gastroenterology

## 2014-01-18 DIAGNOSIS — R1011 Right upper quadrant pain: Secondary | ICD-10-CM

## 2014-01-23 ENCOUNTER — Other Ambulatory Visit: Payer: Self-pay

## 2014-01-25 ENCOUNTER — Other Ambulatory Visit: Payer: Self-pay

## 2014-02-01 ENCOUNTER — Other Ambulatory Visit: Payer: Self-pay

## 2014-02-02 ENCOUNTER — Other Ambulatory Visit: Payer: Self-pay | Admitting: Gastroenterology

## 2014-02-02 ENCOUNTER — Ambulatory Visit
Admission: RE | Admit: 2014-02-02 | Discharge: 2014-02-02 | Disposition: A | Discharge status: Home / Self Care | Payer: BC Managed Care – PPO | Source: Ambulatory Visit | Attending: Gastroenterology | Admitting: Gastroenterology

## 2014-02-02 DIAGNOSIS — R1011 Right upper quadrant pain: Secondary | ICD-10-CM

## 2014-02-02 MED ORDER — IOHEXOL 300 MG/ML  SOLN
100.0000 mL | Freq: Once | INTRAMUSCULAR | Status: AC | PRN
  Administered 2014-02-02: 100 mL via INTRAVENOUS

## 2014-03-06 ENCOUNTER — Other Ambulatory Visit: Payer: Self-pay | Admitting: Gastroenterology

## 2015-12-17 ENCOUNTER — Other Ambulatory Visit (HOSPITAL_COMMUNITY): Payer: Self-pay | Admitting: Respiratory Therapy

## 2015-12-17 DIAGNOSIS — J45909 Unspecified asthma, uncomplicated: Secondary | ICD-10-CM

## 2015-12-17 DIAGNOSIS — R062 Wheezing: Secondary | ICD-10-CM

## 2015-12-17 DIAGNOSIS — R0602 Shortness of breath: Secondary | ICD-10-CM

## 2015-12-23 ENCOUNTER — Ambulatory Visit (HOSPITAL_COMMUNITY)
Admission: RE | Admit: 2015-12-23 | Discharge: 2015-12-23 | Disposition: A | Discharge status: Home / Self Care | Payer: BC Managed Care – PPO | Source: Ambulatory Visit | Attending: Internal Medicine | Admitting: Internal Medicine

## 2015-12-23 DIAGNOSIS — R062 Wheezing: Secondary | ICD-10-CM | POA: Insufficient documentation

## 2015-12-23 DIAGNOSIS — R0602 Shortness of breath: Secondary | ICD-10-CM | POA: Diagnosis present

## 2015-12-23 MED ORDER — ALBUTEROL SULFATE (2.5 MG/3ML) 0.083% IN NEBU
2.5000 mg | INHALATION_SOLUTION | Freq: Once | RESPIRATORY_TRACT | Status: AC
  Administered 2015-12-23: 2.5 mg via RESPIRATORY_TRACT

## 2015-12-25 LAB — PULMONARY FUNCTION TEST
DL/VA % PRED: 100 %
DL/VA: 4.71 ml/min/mmHg/L
DLCO unc % pred: 78 %
DLCO unc: 17.88 ml/min/mmHg
FEF 25-75 POST: 1.26 L/s
FEF 25-75 Pre: 1.73 L/sec
FEF2575-%CHANGE-POST: -27 %
FEF2575-%PRED-PRE: 71 %
FEF2575-%Pred-Post: 52 %
FEV1-%Change-Post: -18 %
FEV1-%Pred-Post: 76 %
FEV1-%Pred-Pre: 93 %
FEV1-PRE: 2.11 L
FEV1-Post: 1.73 L
FEV1FVC-%Change-Post: -16 %
FEV1FVC-%Pred-Pre: 100 %
FEV6-%CHANGE-POST: -1 %
FEV6-%Pred-Post: 92 %
FEV6-%Pred-Pre: 93 %
FEV6-Post: 2.51 L
FEV6-Pre: 2.55 L
FEV6FVC-%PRED-PRE: 103 %
FEV6FVC-%Pred-Post: 103 %
FVC-%CHANGE-POST: -1 %
FVC-%Pred-Post: 89 %
FVC-%Pred-Pre: 90 %
FVC-Post: 2.51 L
FVC-Pre: 2.55 L
POST FEV1/FVC RATIO: 69 %
Post FEV6/FVC ratio: 100 %
Pre FEV1/FVC ratio: 83 %
Pre FEV6/FVC Ratio: 100 %
RV % pred: 102 %
RV: 1.75 L
TLC % pred: 91 %
TLC: 4.49 L

## 2016-05-07 ENCOUNTER — Other Ambulatory Visit: Payer: Self-pay | Admitting: Internal Medicine

## 2016-05-07 DIAGNOSIS — R1011 Right upper quadrant pain: Secondary | ICD-10-CM

## 2016-06-03 ENCOUNTER — Other Ambulatory Visit: Payer: Self-pay

## 2018-08-22 ENCOUNTER — Ambulatory Visit
Admission: RE | Admit: 2018-08-22 | Discharge: 2018-08-22 | Disposition: A | Discharge status: Home / Self Care | Payer: BC Managed Care – PPO | Source: Ambulatory Visit | Attending: Family | Admitting: Family

## 2018-08-22 ENCOUNTER — Other Ambulatory Visit: Payer: Self-pay | Admitting: Family

## 2018-08-22 DIAGNOSIS — R52 Pain, unspecified: Secondary | ICD-10-CM

## 2018-08-22 DIAGNOSIS — M25611 Stiffness of right shoulder, not elsewhere classified: Secondary | ICD-10-CM

## 2018-08-22 DIAGNOSIS — M25511 Pain in right shoulder: Secondary | ICD-10-CM

## 2019-04-19 ENCOUNTER — Other Ambulatory Visit: Payer: Self-pay

## 2019-04-19 DIAGNOSIS — Z20822 Contact with and (suspected) exposure to covid-19: Secondary | ICD-10-CM

## 2019-04-20 LAB — NOVEL CORONAVIRUS, NAA: SARS-CoV-2, NAA: NOT DETECTED

## 2019-04-24 ENCOUNTER — Telehealth: Payer: Self-pay | Admitting: Internal Medicine

## 2019-04-24 NOTE — Telephone Encounter (Signed)
Negative COVID results given. Patient results "NOT Detected." Caller expressed understanding. ° °

## 2019-10-02 ENCOUNTER — Ambulatory Visit: Payer: BC Managed Care – PPO | Attending: Internal Medicine

## 2019-10-02 DIAGNOSIS — Z20822 Contact with and (suspected) exposure to covid-19: Secondary | ICD-10-CM

## 2019-10-03 LAB — NOVEL CORONAVIRUS, NAA: SARS-CoV-2, NAA: NOT DETECTED

## 2019-11-03 ENCOUNTER — Ambulatory Visit: Payer: BC Managed Care – PPO

## 2019-11-06 ENCOUNTER — Ambulatory Visit: Payer: BC Managed Care – PPO | Attending: Internal Medicine

## 2019-11-06 DIAGNOSIS — Z20822 Contact with and (suspected) exposure to covid-19: Secondary | ICD-10-CM

## 2019-11-07 LAB — NOVEL CORONAVIRUS, NAA: SARS-CoV-2, NAA: NOT DETECTED

## 2019-11-20 ENCOUNTER — Ambulatory Visit: Payer: BC Managed Care – PPO | Attending: Internal Medicine

## 2019-11-20 DIAGNOSIS — Z20822 Contact with and (suspected) exposure to covid-19: Secondary | ICD-10-CM

## 2019-11-21 LAB — NOVEL CORONAVIRUS, NAA: SARS-CoV-2, NAA: NOT DETECTED

## 2019-12-24 ENCOUNTER — Emergency Department (HOSPITAL_BASED_OUTPATIENT_CLINIC_OR_DEPARTMENT_OTHER): Payer: BC Managed Care – PPO

## 2019-12-24 ENCOUNTER — Encounter (HOSPITAL_BASED_OUTPATIENT_CLINIC_OR_DEPARTMENT_OTHER): Payer: Self-pay

## 2019-12-24 ENCOUNTER — Emergency Department (HOSPITAL_BASED_OUTPATIENT_CLINIC_OR_DEPARTMENT_OTHER)
Admission: EM | Admit: 2019-12-24 | Discharge: 2019-12-24 | Disposition: A | Discharge status: Home / Self Care | Payer: BC Managed Care – PPO | Attending: Emergency Medicine | Admitting: Emergency Medicine

## 2019-12-24 ENCOUNTER — Other Ambulatory Visit: Payer: Self-pay

## 2019-12-24 DIAGNOSIS — Z79899 Other long term (current) drug therapy: Secondary | ICD-10-CM | POA: Diagnosis not present

## 2019-12-24 DIAGNOSIS — Z91040 Latex allergy status: Secondary | ICD-10-CM | POA: Diagnosis not present

## 2019-12-24 DIAGNOSIS — R110 Nausea: Secondary | ICD-10-CM | POA: Diagnosis not present

## 2019-12-24 DIAGNOSIS — R109 Unspecified abdominal pain: Secondary | ICD-10-CM | POA: Diagnosis present

## 2019-12-24 DIAGNOSIS — R350 Frequency of micturition: Secondary | ICD-10-CM | POA: Insufficient documentation

## 2019-12-24 DIAGNOSIS — N201 Calculus of ureter: Secondary | ICD-10-CM | POA: Diagnosis not present

## 2019-12-24 LAB — CBC WITH DIFFERENTIAL/PLATELET
Abs Immature Granulocytes: 0.01 10*3/uL (ref 0.00–0.07)
Basophils Absolute: 0 10*3/uL (ref 0.0–0.1)
Basophils Relative: 1 %
Eosinophils Absolute: 0.1 10*3/uL (ref 0.0–0.5)
Eosinophils Relative: 1 %
HCT: 34.9 % — ABNORMAL LOW (ref 36.0–46.0)
Hemoglobin: 11.6 g/dL — ABNORMAL LOW (ref 12.0–15.0)
Immature Granulocytes: 0 %
Lymphocytes Relative: 19 %
Lymphs Abs: 1.1 10*3/uL (ref 0.7–4.0)
MCH: 28 pg (ref 26.0–34.0)
MCHC: 33.2 g/dL (ref 30.0–36.0)
MCV: 84.3 fL (ref 80.0–100.0)
Monocytes Absolute: 0.3 10*3/uL (ref 0.1–1.0)
Monocytes Relative: 5 %
Neutro Abs: 4.4 10*3/uL (ref 1.7–7.7)
Neutrophils Relative %: 74 %
Platelets: 248 10*3/uL (ref 150–400)
RBC: 4.14 MIL/uL (ref 3.87–5.11)
RDW: 14.6 % (ref 11.5–15.5)
WBC: 5.9 10*3/uL (ref 4.0–10.5)
nRBC: 0 % (ref 0.0–0.2)

## 2019-12-24 LAB — URINALYSIS, MICROSCOPIC (REFLEX)

## 2019-12-24 LAB — URINALYSIS, ROUTINE W REFLEX MICROSCOPIC
Bilirubin Urine: NEGATIVE
Glucose, UA: NEGATIVE mg/dL
Ketones, ur: NEGATIVE mg/dL
Nitrite: NEGATIVE
Protein, ur: NEGATIVE mg/dL
Specific Gravity, Urine: 1.01 (ref 1.005–1.030)
pH: 5.5 (ref 5.0–8.0)

## 2019-12-24 LAB — BASIC METABOLIC PANEL
Anion gap: 9 (ref 5–15)
BUN: 9 mg/dL (ref 6–20)
CO2: 23 mmol/L (ref 22–32)
Calcium: 9.8 mg/dL (ref 8.9–10.3)
Chloride: 105 mmol/L (ref 98–111)
Creatinine, Ser: 0.36 mg/dL — ABNORMAL LOW (ref 0.44–1.00)
GFR calc Af Amer: >60 mL/min (ref >60)
GFR calc non Af Amer: >60 mL/min (ref >60)
Glucose, Bld: 112 mg/dL — ABNORMAL HIGH (ref 70–99)
Potassium: 3.8 mmol/L (ref 3.5–5.1)
Sodium: 137 mmol/L (ref 135–145)

## 2019-12-24 MED ORDER — TAMSULOSIN HCL 0.4 MG PO CAPS: 0.4000 mg | ORAL_CAPSULE | Freq: Every day | ORAL | 0 refills | Status: DC

## 2019-12-24 MED ORDER — KETOROLAC TROMETHAMINE 30 MG/ML IJ SOLN
30.0000 mg | Freq: Once | INTRAMUSCULAR | Status: AC
  Administered 2019-12-24: 30 mg via INTRAMUSCULAR

## 2019-12-24 MED ORDER — ONDANSETRON 4 MG PO TBDP: 4.0000 mg | ORAL_TABLET | Freq: Three times a day (TID) | ORAL | 0 refills | Status: AC | PRN

## 2019-12-24 MED ORDER — ONDANSETRON 4 MG PO TBDP: 4.0000 mg | ORAL_TABLET | Freq: Three times a day (TID) | ORAL | 0 refills | Status: DC | PRN

## 2019-12-24 MED ORDER — TAMSULOSIN HCL 0.4 MG PO CAPS: 0.4000 mg | ORAL_CAPSULE | Freq: Every day | ORAL | 0 refills | Status: AC

## 2019-12-24 MED ORDER — HYDROCODONE-ACETAMINOPHEN 5-325 MG PO TABS: 2.0000 | ORAL_TABLET | Freq: Four times a day (QID) | ORAL | 0 refills | Status: DC | PRN

## 2019-12-24 MED ORDER — KETOROLAC TROMETHAMINE 30 MG/ML IJ SOLN
INTRAMUSCULAR | Status: AC
  Filled 2019-12-24: qty 1

## 2019-12-24 MED ORDER — HYDROCODONE-ACETAMINOPHEN 5-325 MG PO TABS: 2.0000 | ORAL_TABLET | Freq: Four times a day (QID) | ORAL | 0 refills | Status: AC | PRN

## 2019-12-24 NOTE — ED Triage Notes (Signed)
Pt c/o L side low back pain with radiation to L leg. Pt denies any injury or aggravating factors.
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
## 2019-12-24 NOTE — Discharge Instructions (Addendum)
Please pick up medications and take as prescribed Drink plenty of water to help flush out the kidney stones  Call [facility] to schedule an appointment for further eval in the next 1-2 weeks Return to the ED IMMEDIATELY for any worsening symptoms including fevers > 100.5/chills, worsening pain, excessive vomiting

## 2019-12-26 LAB — URINE CULTURE: Culture: <10000 — AB

## 2020-09-04 ENCOUNTER — Other Ambulatory Visit: Payer: BC Managed Care – PPO

## 2020-09-10 ENCOUNTER — Other Ambulatory Visit: Payer: Self-pay | Admitting: Nurse Practitioner

## 2020-09-10 DIAGNOSIS — G43009 Migraine without aura, not intractable, without status migrainosus: Secondary | ICD-10-CM

## 2020-09-25 ENCOUNTER — Other Ambulatory Visit: Payer: BC Managed Care – PPO

## 2021-07-14 ENCOUNTER — Other Ambulatory Visit: Payer: Self-pay | Admitting: Obstetrics and Gynecology

## 2021-07-14 DIAGNOSIS — E041 Nontoxic single thyroid nodule: Secondary | ICD-10-CM

## 2021-08-05 ENCOUNTER — Ambulatory Visit
Admission: RE | Admit: 2021-08-05 | Discharge: 2021-08-05 | Disposition: A | Discharge status: Home / Self Care | Payer: BC Managed Care – PPO | Source: Ambulatory Visit | Attending: Obstetrics and Gynecology | Admitting: Obstetrics and Gynecology

## 2021-08-05 DIAGNOSIS — E041 Nontoxic single thyroid nodule: Secondary | ICD-10-CM
